# Patient Record
Sex: Male | Born: 1985 | Race: White | Hispanic: No | Marital: Married | Smoking: Never smoker
Health system: Southern US, Community
[De-identification: ages and names within clinical notes are randomized; demographics above are authoritative.]

## PROBLEM LIST (undated history)

## (undated) DIAGNOSIS — N200 Calculus of kidney: Secondary | ICD-10-CM

## (undated) DIAGNOSIS — J45909 Unspecified asthma, uncomplicated: Secondary | ICD-10-CM

## (undated) HISTORY — DX: Unspecified asthma, uncomplicated: J45.909

## (undated) HISTORY — PX: ANKLE SURGERY: SHX546

---

## 2013-12-03 ENCOUNTER — Ambulatory Visit (INDEPENDENT_AMBULATORY_CARE_PROVIDER_SITE_OTHER): Payer: Managed Care, Other (non HMO) | Admitting: Internal Medicine

## 2013-12-03 ENCOUNTER — Encounter: Payer: Self-pay | Admitting: Internal Medicine

## 2013-12-03 VITALS — BP 116/68 | HR 68 | Temp 98.4°F | Ht 64.5 in | Wt 163.0 lb

## 2013-12-03 DIAGNOSIS — J45909 Unspecified asthma, uncomplicated: Secondary | ICD-10-CM

## 2013-12-03 DIAGNOSIS — S9700XA Crushing injury of unspecified ankle, initial encounter: Secondary | ICD-10-CM

## 2013-12-03 DIAGNOSIS — S9702XA Crushing injury of left ankle, initial encounter: Secondary | ICD-10-CM

## 2013-12-03 NOTE — Patient Instructions (Addendum)
You have no evidence of any ankle deformity, ankle pain, decreased strength or ability to bear weight in the ankle. Your spirometry results that you do not have asthma.    Health Maintenance, Males A healthy lifestyle and preventative care can promote health and wellness.  Maintain regular health, dental, and eye exams.  Eat a healthy diet. Foods like vegetables, fruits, whole grains, low-fat dairy products, and lean protein foods contain the nutrients you need and are low in calories. Decrease your intake of foods high in solid fats, added sugars, and salt. Get information about a proper diet from your health care provider, if necessary.  Regular physical exercise is one of the most important things you can do for your health. Most adults should get at least 150 minutes of moderate-intensity exercise (any activity that increases your heart rate and causes you to sweat) each week. In addition, most adults need muscle-strengthening exercises on 2 or more days a week.   Maintain a healthy weight. The body mass index (BMI) is a screening tool to identify possible weight problems. It provides an estimate of body fat based on height and weight. Your health care provider can find your BMI and can help you achieve or maintain a healthy weight. For males 20 years and older:  A BMI below 18.5 is considered underweight.  A BMI of 18.5 to 24.9 is normal.  A BMI of 25 to 29.9 is considered overweight.  A BMI of 30 and above is considered obese.  Maintain normal blood lipids and cholesterol by exercising and minimizing your intake of saturated fat. Eat a balanced diet with plenty of fruits and vegetables. Blood tests for lipids and cholesterol should begin at age 21 and be repeated every 5 years. If your lipid or cholesterol levels are high, you are over 50, or you are at high risk for heart disease, you may need your cholesterol levels checked more frequently.Ongoing high lipid and cholesterol levels  should be treated with medicines, if diet and exercise are not working.  If you smoke, find out from your health care provider how to quit. If you do not use tobacco, do not start.  Lung cancer screening is recommended for adults aged 61 80 years who are at high risk for developing lung cancer because of a history of smoking. A yearly low-dose CT scan of the lungs is recommended for people who have at least a 30-pack-year history of smoking and are a current smoker or have quit within the past 15 years. A pack year of smoking is smoking an average of 1 pack of cigarettes a day for 1 year (for example, a 30-pack-year history of smoking could mean smoking 1 pack a day for 30 years or 2 packs a day for 15 years). Yearly screening should continue until the smoker has stopped smoking for at least 15 years. Yearly screening should be stopped for people who develop a health problem that would prevent them from having lung cancer treatment.  If you choose to drink alcohol, do not have more than 2 drinks per day. One drink is considered to be 12 oz (360 mL) of beer, 5 oz (150 mL) of wine, or 1.5 oz (45 mL) of liquor.  Avoid use of street drugs. Do not share needles with anyone. Ask for help if you need support or instructions about stopping the use of drugs.  High blood pressure causes heart disease and increases the risk of stroke. Blood pressure should be checked at least  every 1 2 years. Ongoing high blood pressure should be treated with medicines if weight loss and exercise are not effective.  If you are 33 27 years old, ask your health care provider if you should take aspirin to prevent heart disease.  Diabetes screening involves taking a blood sample to check your fasting blood sugar level. This should be done once every 3 years after age 45, if you are at a normal weight and without risk factors for diabetes. Testing should be considered at a younger age or be carried out more frequently if you are  overweight and have at least 1 risk factor for diabetes.  Colorectal cancer can be detected and often prevented. Most routine colorectal cancer screening begins at the age of 99 and continues through age 55. However, your health care provider may recommend screening at an earlier age if you have risk factors for colon cancer. On a yearly basis, your health care provider may provide home test kits to check for hidden blood in the stool. A small camera at the end of a tube may be used to directly examine the colon (sigmoidoscopy or colonoscopy) to detect the earliest forms of colorectal cancer. Talk to your health care provider about this at age 69, when routine screening begins. A direct exam of the colon should be repeated every 5 10 years through age 40, unless early forms of pre-cancerous polyps or small growths are found.  People who are at an increased risk for hepatitis B should be screened for this virus. You are considered at high risk for hepatitis B if:  You were born in a country where hepatitis B occurs often. Talk with your health care provider about which countries are considered high-risk.  Your parents were born in a high-risk country and you have not received a shot to protect against hepatitis B (hepatitis B vaccine).  You have HIV or AIDS.  You use needles to inject street drugs.  You live with, or have sex with, someone who has hepatitis B.  You are a man who has sex with other men (MSM).  You get hemodialysis treatment.  You take certain medicines for conditions like cancer, organ transplantation, and autoimmune conditions.  Hepatitis C blood testing is recommended for all people born from [birth year] through 1964 and any individual with known risk factors for hepatitis C.  Healthy men should no longer receive prostate-specific antigen (PSA) blood tests as part of routine cancer screening. Talk to your health care provider about prostate cancer screening.  Testicular cancer  screening is not recommended for adolescents or adult males who have no symptoms. Screening includes self-exam, a health care provider exam, and other screening tests. Consult with your health care provider about any symptoms you have or any concerns you have about testicular cancer.  Practice safe sex. Use condoms and avoid high-risk sexual practices to reduce the spread of sexually transmitted infections (STIs).  Use sunscreen. Apply sunscreen liberally and repeatedly throughout the day. You should seek shade when your shadow is shorter than you. Protect yourself by wearing long sleeves, pants, a wide-brimmed hat, and sunglasses year round, whenever you are outdoors.  Tell your health care provider of new moles or changes in moles, especially if there is a change in shape or color. Also tell your provider if a mole is larger than the size of a pencil eraser.  A one-time screening for abdominal aortic aneurysm (AAA) and surgical repair of large AAAs by ultrasound is recommended for men  aged 27 75 years who are current or former smokers.  Stay current with your vaccines (immunizations). Document Released: 02/09/2008 Document Revised: 06/03/2013 Document Reviewed: 01/08/2011 [facility] Patient Information 2014 [place], [place].

## 2013-12-03 NOTE — Progress Notes (Signed)
Pre visit review using our clinic review tool, if applicable. No additional management support is needed unless otherwise documented below in the visit note. 

## 2013-12-03 NOTE — Progress Notes (Signed)
HPI  Pt presents to the clinic today to establish care. He does not have a PCP. He does report that he had his left ankle crushed by a truck in 2008. He did have pins put at during that time. He no longer has any pain. He does just need to make sure it is stable so that he can go into the [employer]. He also needs repeat spirometry to make sure he no longer has asthma. This was a childhood issue. He denies wheezing, or shortness of breath.  Past Medical History  Diagnosis Date  . Asthma     No current outpatient prescriptions on file.   No current facility-administered medications for this visit.    Allergies  Allergen Reactions  . Sulfa Antibiotics Hives    Family History  Problem Relation Age of Onset  . Hypertension Mother   . Hypertension Maternal Grandmother   . Hypertension Maternal Grandfather     History   Social History  . Marital Status: Married    Spouse Name: N/A    Number of Children: N/A  . Years of Education: N/A   Occupational History  . Not on file.   Social History Main Topics  . Smoking status: Never Smoker   . Smokeless tobacco: Not on file  . Alcohol Use: No  . Drug Use: No  . Sexual Activity: Not on file   Other Topics Concern  . Not on file   Social History Narrative  . No narrative on file    ROS:  Constitutional: Denies fever, malaise, fatigue, headache or abrupt weight changes.  Respiratory: Denies difficulty breathing, shortness of breath, cough or sputum production.   Cardiovascular: Denies chest pain, chest tightness, palpitations or swelling in the hands or feet.  Musculoskeletal: Denies decrease in range of motion, difficulty with gait, muscle pain or joint pain and swelling.   No other specific complaints in a complete review of systems (except as listed in HPI above).  PE:  BP 116/68  Pulse 68  Temp(Src) 98.4 F (36.9 C) (Oral)  Ht 5' 4.5" (1.638 m)  Wt 163 lb (73.936 kg)  BMI 27.56 kg/m2  SpO2 98% Wt Readings from  Last 3 Encounters:  12/03/13 163 lb (73.936 kg)    General: Appears his stated age, well developed, well nourished in NAD. Cardiovascular: Normal rate and rhythm. S1,S2 noted.  No murmur, rubs or gallops noted. No JVD or BLE edema. No carotid bruits noted. Pulmonary/Chest: Normal effort and positive vesicular breath sounds. No respiratory distress. No wheezes, rales or ronchi noted.  Musculoskeletal: Normal range of motion of left ankle. No signs of joint swelling. No difficulty with gait. Strength normal    Assessment and Plan:  Left ankle injury:  Has resolved No weight bearing concerns at this time  History of childhood asthma:  Will perform spirometry today No evidence of asthma  RTC as needed

## 2014-02-09 ENCOUNTER — Telehealth: Payer: Self-pay

## 2014-02-09 NOTE — Telephone Encounter (Signed)
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 Wanted Nicki Reaperegina Baity NP to be aware fax should arrive on 02/10/14.

## 2014-02-10 ENCOUNTER — Encounter: Payer: Self-pay | Admitting: Internal Medicine

## 2014-02-10 NOTE — Telephone Encounter (Signed)
Ok I will look for it in my inbox

## 2014-02-10 NOTE — Telephone Encounter (Signed)
Done, placed in my outbox.

## 2014-02-11 NOTE — Telephone Encounter (Signed)
The letter and office visit notes were faxed 02/10/14--

## 2016-08-17 ENCOUNTER — Encounter: Payer: Managed Care, Other (non HMO) | Admitting: Internal Medicine

## 2016-08-17 ENCOUNTER — Telehealth: Payer: Self-pay | Admitting: Internal Medicine

## 2016-08-17 NOTE — Telephone Encounter (Signed)
Patient did not come in for their appointment today for cpe.  Please let me know if patient needs to be contacted immediately for follow up or no follow up needed. °

## 2016-08-17 NOTE — Telephone Encounter (Signed)
Yes, he needs to reschedule 

## 2016-08-21 NOTE — Telephone Encounter (Signed)
Pt declined to reschedule appt

## 2016-11-06 ENCOUNTER — Telehealth: Payer: Self-pay | Admitting: Internal Medicine

## 2016-11-06 NOTE — Telephone Encounter (Signed)
Wife states that husband did not make appt for cpe on 12/22. Wife is asking for no show fee to be forgiven. Please call [phone] Thanks

## 2017-01-02 NOTE — Telephone Encounter (Signed)
Patient's wife called and said she received another no charge bill.  Please call Brianna back.

## 2017-07-09 DIAGNOSIS — Z8669 Personal history of other diseases of the nervous system and sense organs: Secondary | ICD-10-CM | POA: Insufficient documentation

## 2018-04-15 ENCOUNTER — Telehealth: Payer: Self-pay | Admitting: Internal Medicine

## 2018-04-15 NOTE — Telephone Encounter (Signed)
See below crm New patient 11/2013 No show for cpx 07/2013 Ok to working?  Copied from CRM [phone]. Topic: Appointment Scheduling - Scheduling Inquiry for Clinic >> Apr 14, 2018  3:06 PM Oneal GroutSebastian, Jennifer S wrote: Reason for CRM: Patient last seen in 2015 with Nicki Reaperegina Baity, requesting CPE. Needs by 1st of Sept.  Able to work in?

## 2018-04-15 NOTE — Telephone Encounter (Signed)
Yes, can reestablish at time of physical.

## 2018-04-15 NOTE — Telephone Encounter (Signed)
Appointment 8/27 pt aware

## 2018-04-15 NOTE — Telephone Encounter (Signed)
Can I work him in before sept 1st

## 2018-04-15 NOTE — Telephone Encounter (Signed)
yes

## 2018-04-22 ENCOUNTER — Ambulatory Visit (INDEPENDENT_AMBULATORY_CARE_PROVIDER_SITE_OTHER): Payer: 59 | Admitting: Internal Medicine

## 2018-04-22 ENCOUNTER — Encounter: Payer: Self-pay | Admitting: Internal Medicine

## 2018-04-22 ENCOUNTER — Encounter (INDEPENDENT_AMBULATORY_CARE_PROVIDER_SITE_OTHER): Payer: Self-pay

## 2018-04-22 VITALS — BP 114/78 | HR 65 | Temp 97.9°F | Ht 64.5 in | Wt 186.0 lb

## 2018-04-22 DIAGNOSIS — Z0000 Encounter for general adult medical examination without abnormal findings: Secondary | ICD-10-CM

## 2018-04-22 DIAGNOSIS — K219 Gastro-esophageal reflux disease without esophagitis: Secondary | ICD-10-CM

## 2018-04-22 DIAGNOSIS — J45909 Unspecified asthma, uncomplicated: Secondary | ICD-10-CM | POA: Insufficient documentation

## 2018-04-22 DIAGNOSIS — J4520 Mild intermittent asthma, uncomplicated: Secondary | ICD-10-CM

## 2018-04-22 NOTE — Assessment & Plan Note (Signed)
Discussed how weight loss can help improve reflux Start Ranitidine 75 mg OTC QHS  Let me know if not improving

## 2018-04-22 NOTE — Progress Notes (Signed)
HPI  Pt presents to the clinic today to reestablish care. He would like his annual exam today.  Childhood Asthma: Currently not an issue. He does not use any inhalers.  Flu: never Tetanus: 2008 Dentist: annually  Diet: He does eat meat. He consumes fruits and veggies daily. He rarely eats fried foods. He drinks mostly water and coffee. Exercise: He goes to the gym for about 1.5 hours 3-4 days per week.  Past Medical History:  Diagnosis Date  . Asthma     No current outpatient medications on file.   No current facility-administered medications for this visit.     Allergies  Allergen Reactions  . Sulfa Antibiotics Hives    Family History  Problem Relation Age of Onset  . Hypertension Mother   . Hypertension Maternal Grandmother   . Hypertension Maternal Grandfather     Social History   Socioeconomic History  . Marital status: Married    Spouse name: Not on file  . Number of children: Not on file  . Years of education: Not on file  . Highest education level: Not on file  Occupational History  . Not on file  Social Needs  . Financial resource strain: Not on file  . Food insecurity:    Worry: Not on file    Inability: Not on file  . Transportation needs:    Medical: Not on file    Non-medical: Not on file  Tobacco Use  . Smoking status: Never Smoker  . Smokeless tobacco: Never Used  Substance and Sexual Activity  . Alcohol use: No  . Drug use: No  . Sexual activity: Not on file  Lifestyle  . Physical activity:    Days per week: Not on file    Minutes per session: Not on file  . Stress: Not on file  Relationships  . Social connections:    Talks on phone: Not on file    Gets together: Not on file    Attends religious service: Not on file    Active member of club or organization: Not on file    Attends meetings of clubs or organizations: Not on file    Relationship status: Not on file  . Intimate partner violence:    Fear of current or ex partner: Not  on file    Emotionally abused: Not on file    Physically abused: Not on file    Forced sexual activity: Not on file  Other Topics Concern  . Not on file  Social History Narrative  . Not on file    ROS:  Constitutional: Denies fever, malaise, fatigue, headache or abrupt weight changes.  HEENT: Denies eye pain, eye redness, ear pain, ringing in the ears, wax buildup, runny nose, nasal congestion, bloody nose, or sore throat. Respiratory: Denies difficulty breathing, shortness of breath, cough or sputum production.   Cardiovascular: Denies chest pain, chest tightness, palpitations or swelling in the hands or feet.  Gastrointestinal: Pt reports reflux. Denies abdominal pain, bloating, constipation, diarrhea or blood in the stool.  GU: Denies frequency, urgency, pain with urination, blood in urine, odor or discharge. Musculoskeletal: Denies decrease in range of motion, difficulty with gait, muscle pain or joint pain and swelling.  Skin: Denies redness, rashes, lesions or ulcercations.  Neurological: Denies dizziness, difficulty with memory, difficulty with speech or problems with balance and coordination.  Psych: Denies anxiety, depression, SI/HI.  No other specific complaints in a complete review of systems (except as listed in HPI above).  PE:  BP 114/78   Pulse 65   Temp 97.9 F (36.6 C) (Oral)   Ht 5' 4.5" (1.638 m)   Wt 186 lb (84.4 kg)   SpO2 98%   BMI 31.43 kg/m  Wt Readings from Last 3 Encounters:  04/22/18 186 lb (84.4 kg)  12/03/13 163 lb (73.9 kg)    General: Appears his stated age, well developed, well nourished in NAD. HEENT: Head: normal shape and size; Eyes: sclera white, no icterus, conjunctiva pink, PERRLA and EOMs intact; Ears: Tm's gray and intact, normal light reflex; Throat/Mouth: Teeth present, mucosa pink and moist, no lesions or ulcerations noted.  Neck: Neck supple, trachea midline. No masses, lumps or thyromegaly present.  Cardiovascular: Normal rate  and rhythm. S1,S2 noted.  No murmur, rubs or gallops noted. No JVD or BLE edema.  Pulmonary/Chest: Normal effort and positive vesicular breath sounds. No respiratory distress. No wheezes, rales or ronchi noted.  Abdomen: Soft and nontender. Normal bowel sounds, no bruits noted. No distention or masses noted. Liver, spleen and kidneys non palpable. Musculoskeletal:  Strength 5/5 BUE/BLE. No difficulty with gait.  Neurological: Alert and oriented. Cranial nerves II-XII grossly intact. Coordination normal.  Psychiatric: Mood and affect normal. Behavior is normal. Judgment and thought content normal.    Assessment and Plan:  Preventative Health Maintenance:  Encouraged him to get a flu shot in the fall He declines tetanus booster today Encouraged him to consume a balanced diet and exercise regimen Advised him to see an eye doctor and dentist annually He had labs through employer which were reviewed  RTC in 1 year, sooner if needed  Nicki Reaperegina Baity, NP

## 2018-04-22 NOTE — Patient Instructions (Signed)

## 2018-04-22 NOTE — Assessment & Plan Note (Signed)
Currently not an issue Will monitor 

## 2019-04-19 ENCOUNTER — Emergency Department: Payer: No Typology Code available for payment source

## 2019-04-19 ENCOUNTER — Other Ambulatory Visit: Payer: Self-pay

## 2019-04-19 DIAGNOSIS — R1011 Right upper quadrant pain: Secondary | ICD-10-CM | POA: Diagnosis present

## 2019-04-19 DIAGNOSIS — J45909 Unspecified asthma, uncomplicated: Secondary | ICD-10-CM | POA: Diagnosis not present

## 2019-04-19 DIAGNOSIS — N200 Calculus of kidney: Secondary | ICD-10-CM | POA: Diagnosis not present

## 2019-04-19 LAB — COMPREHENSIVE METABOLIC PANEL
ALT: 59 U/L — ABNORMAL HIGH (ref 0–44)
AST: 42 U/L — ABNORMAL HIGH (ref 15–41)
Albumin: 4.3 g/dL (ref 3.5–5.0)
Alkaline Phosphatase: 76 U/L (ref 38–126)
Anion gap: 9 (ref 5–15)
BUN: 14 mg/dL (ref 6–20)
CO2: 24 mmol/L (ref 22–32)
Calcium: 8.8 mg/dL — ABNORMAL LOW (ref 8.9–10.3)
Chloride: 105 mmol/L (ref 98–111)
Creatinine, Ser: 0.9 mg/dL (ref 0.61–1.24)
GFR calc Af Amer: >60 mL/min (ref >60)
GFR calc non Af Amer: >60 mL/min (ref >60)
Glucose, Bld: 127 mg/dL — ABNORMAL HIGH (ref 70–99)
Potassium: 3.8 mmol/L (ref 3.5–5.1)
Sodium: 138 mmol/L (ref 135–145)
Total Bilirubin: 1.1 mg/dL (ref 0.3–1.2)
Total Protein: 7.3 g/dL (ref 6.5–8.1)

## 2019-04-19 LAB — CBC WITH DIFFERENTIAL/PLATELET
Abs Immature Granulocytes: 0.04 10*3/uL (ref 0.00–0.07)
Basophils Absolute: 0.1 10*3/uL (ref 0.0–0.1)
Basophils Relative: 1 %
Eosinophils Absolute: 0.1 10*3/uL (ref 0.0–0.5)
Eosinophils Relative: 1 %
HCT: 46.1 % (ref 39.0–52.0)
Hemoglobin: 16.3 g/dL (ref 13.0–17.0)
Immature Granulocytes: 0 %
Lymphocytes Relative: 15 %
Lymphs Abs: 1.6 10*3/uL (ref 0.7–4.0)
MCH: 31.8 pg (ref 26.0–34.0)
MCHC: 35.4 g/dL (ref 30.0–36.0)
MCV: 90 fL (ref 80.0–100.0)
Monocytes Absolute: 1 10*3/uL (ref 0.1–1.0)
Monocytes Relative: 9 %
Neutro Abs: 8.1 10*3/uL — ABNORMAL HIGH (ref 1.7–7.7)
Neutrophils Relative %: 74 %
Platelets: 209 10*3/uL (ref 150–400)
RBC: 5.12 MIL/uL (ref 4.22–5.81)
RDW: 12.2 % (ref 11.5–15.5)
WBC: 10.8 10*3/uL — ABNORMAL HIGH (ref 4.0–10.5)
nRBC: 0 % (ref 0.0–0.2)

## 2019-04-19 MED ORDER — ONDANSETRON HCL 4 MG/2ML IJ SOLN: 4.0000 mg | Freq: Once | INTRAMUSCULAR | Status: DC

## 2019-04-19 MED ORDER — ONDANSETRON HCL 4 MG/2ML IJ SOLN
INTRAMUSCULAR | Status: AC
  Administered 2019-04-19: 4 mg
  Filled 2019-04-19: qty 2

## 2019-04-19 MED ORDER — FENTANYL CITRATE (PF) 100 MCG/2ML IJ SOLN
INTRAMUSCULAR | Status: AC
  Administered 2019-04-19: 23:00:00 50 ug via INTRAVENOUS
  Filled 2019-04-19: qty 2

## 2019-04-19 MED ORDER — FENTANYL CITRATE (PF) 100 MCG/2ML IJ SOLN
50.0000 ug | INTRAMUSCULAR | Status: DC | PRN
  Administered 2019-04-19: 50 ug via INTRAVENOUS

## 2019-04-19 NOTE — ED Notes (Signed)
Patient to US at this time via wheelchair.

## 2019-04-19 NOTE — ED Triage Notes (Signed)
Patient presents with right upper quadrant pain that extends down into the flank and over the bladder. Patient states he has been unable to eat anything. Tried to make himself vomit but cannot. No history of kidney stones in the past.

## 2019-04-20 ENCOUNTER — Emergency Department
Admission: EM | Admit: 2019-04-20 | Discharge: 2019-04-20 | Disposition: A | Discharge status: Home / Self Care | Payer: No Typology Code available for payment source | Attending: Emergency Medicine | Admitting: Emergency Medicine

## 2019-04-20 ENCOUNTER — Emergency Department: Payer: No Typology Code available for payment source

## 2019-04-20 DIAGNOSIS — R1011 Right upper quadrant pain: Secondary | ICD-10-CM

## 2019-04-20 DIAGNOSIS — N200 Calculus of kidney: Secondary | ICD-10-CM

## 2019-04-20 LAB — URINALYSIS, ROUTINE W REFLEX MICROSCOPIC
Bilirubin Urine: NEGATIVE
Glucose, UA: NEGATIVE mg/dL
Ketones, ur: 20 mg/dL — AB
Leukocytes,Ua: NEGATIVE
Nitrite: NEGATIVE
Protein, ur: 100 mg/dL — AB
RBC / HPF: >50 RBC/hpf — ABNORMAL HIGH (ref 0–5)
Specific Gravity, Urine: 1.02 (ref 1.005–1.030)
pH: 6 (ref 5.0–8.0)

## 2019-04-20 LAB — LIPASE, BLOOD: Lipase: 22 U/L (ref 11–51)

## 2019-04-20 MED ORDER — HYDROCODONE-ACETAMINOPHEN 5-325 MG PO TABS
1.0000 | ORAL_TABLET | Freq: Once | ORAL | Status: AC
  Administered 2019-04-20: 04:00:00 1 via ORAL
  Filled 2019-04-20: qty 1

## 2019-04-20 MED ORDER — ONDANSETRON 4 MG PO TBDP: 4.0000 mg | ORAL_TABLET | Freq: Four times a day (QID) | ORAL | 0 refills | Status: DC | PRN

## 2019-04-20 MED ORDER — HYDROCODONE-ACETAMINOPHEN 5-325 MG PO TABS: 1.0000 | ORAL_TABLET | Freq: Four times a day (QID) | ORAL | 0 refills | Status: DC | PRN

## 2019-04-20 MED ORDER — IBUPROFEN 400 MG PO TABS
400.0000 mg | ORAL_TABLET | ORAL | Status: AC
  Administered 2019-04-20: 400 mg via ORAL
  Filled 2019-04-20: qty 1

## 2019-04-20 MED ORDER — KETOROLAC TROMETHAMINE 30 MG/ML IJ SOLN
30.0000 mg | Freq: Once | INTRAMUSCULAR | Status: AC
  Administered 2019-04-20: 01:00:00 30 mg via INTRAVENOUS
  Filled 2019-04-20: qty 1

## 2019-04-20 NOTE — Discharge Instructions (Addendum)
You have been seen in the Emergency Department (ED) today for pain that we believe based on your workup, is caused by kidney stones.  As we have discussed, please drink plenty of fluids.  Please make a follow up appointment with the physician(s) listed elsewhere in this documentation. ° °You may take pain medication as needed but ONLY as prescribed.   We also recommend that you take over-the-counter ibuprofen regularly according to label instructions over the next 5 days.  Take it with meals to minimize stomach discomfort. ° °Please see your doctor as soon as possible as stones may take 1-3 weeks to pass and you may require additional care or medications. ° °Do not drink alcohol, drive or participate in any other potentially dangerous activities while taking opiate pain medication as it may make you sleepy. Do not take this medication with any other sedating medications, either prescription or over-the-counter. If you were prescribed Percocet or Vicodin, do not take these with acetaminophen (Tylenol) as it is already contained within these medications. °  °This medication is an opiate (or narcotic) pain medication and can be habit forming.  Use it as little as possible to achieve adequate pain control.  Do not use or use it with extreme caution if you have a history of opiate abuse or dependence.  If you are on a pain contract with your primary care doctor or a pain specialist, be sure to let them know you were prescribed this medication today from the [facility].  This medication is intended for your use only - do not give any to anyone else and keep it in a secure place where nobody else, especially children, have access to it.  It will also cause or worsen constipation, so you may want to consider taking an over-the-counter stool softener while you are taking this medication. ° °Return to the Emergency Department (ED) or call your doctor if you have any worsening pain, fever, painful  urination, are unable to urinate, or develop other symptoms that concern you. ° °

## 2019-04-20 NOTE — ED Provider Notes (Signed)
[facility] Emergency Department Provider Note   ____________________________________________   First MD Initiated Contact with Patient 04/20/19 0100     (approximate)  I have reviewed the triage vital signs and the nursing notes.   HISTORY  Chief Complaint Abdominal Pain (Right upper quadrant)    HPI Eric Erickson. is a 33 y.o. male here for evaluation of abdominal pain  Patient reports this morning he got up he noticed when he urinated he had some slight blood in it.  A bit of discomfort and anything might show.  Then went throughout the day, started having increased pain tonight, reports of pain over the right flank right lower quadrant associated with urinary frequency.  He said the pain got pretty bad for little while, and then it now is calm back down.  No vomiting.  No fevers or chills.  No exposure to anyone with coronavirus.  No cough.  No shortness of breath  Reports he is feeling better still having some achiness over the right flank region, but not any severe pain like it was earlier.  Has never had anything like this before.   Past Medical History:  Diagnosis Date  . Asthma     Patient Active Problem List   Diagnosis Date Noted  . GERD (gastroesophageal reflux disease) 04/22/2018  . 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 ANKLE SURGERY Left    screw put in    Prior to Admission medications   Medication Sig Start Date End Date Taking? Authorizing Provider  HYDROcodone-acetaminophen (NORCO/VICODIN) 5-325 MG tablet Take 1-2 tablets by mouth every 6 (six) hours as needed for moderate pain or severe pain. 04/20/19   Delman Kitten, MD  ondansetron (ZOFRAN ODT) 4 MG disintegrating tablet Take 1 tablet (4 mg total) by mouth every 6 (six) hours as needed for nausea or vomiting. 04/20/19   Delman Kitten, MD    Allergies Sulfa antibiotics  Family History  Problem Relation Age of Onset  .  Hypertension Mother   . Hypertension Maternal Grandmother   . Hypertension Maternal Grandfather     Social History Social History   Tobacco Use  . Smoking status: Never Smoker  . Smokeless tobacco: Never Used  Substance Use Topics  . Alcohol use: No  . Drug use: No    Review of Systems Constitutional: No fever/chills Eyes: No visual changes. ENT: No sore throat. Cardiovascular: Denies chest pain. Respiratory: Denies shortness of breath. Gastrointestinal: See HPI Genitourinary: Negative for dysuria.  Urgency and some slight blood in his urine. Musculoskeletal: Negative for back pain. Skin: Negative for rash. Neurological: Negative for headaches, areas of focal weakness or numbness.    ____________________________________________   PHYSICAL EXAM:  VITAL SIGNS: ED Triage Vitals  Enc Vitals Group     BP 04/19/19 2251 (!) 137/109     Pulse Rate 04/19/19 2251 (!) 50     Resp 04/19/19 2251 (!) 22     Temp 04/19/19 2251 98 F (36.7 C)     Temp Source 04/19/19 2251 Oral     SpO2 04/19/19 2251 98 %     Weight 04/19/19 2252 175 lb (79.4 kg)     Height 04/19/19 2252 5\' 6"  (1.676 m)     Head Circumference --      Peak Flow --      Pain Score 04/19/19 2252 10     Pain Loc --      Pain Edu? --  Excl. in GC? --     Constitutional: Alert and oriented. Well appearing and in no acute distress. Eyes: Conjunctivae are normal. Head: Atraumatic. Nose: No congestion/rhinnorhea. Mouth/Throat: Mucous membranes are moist. Neck: No stridor.  Cardiovascular: Normal rate, regular rhythm. Grossly normal heart sounds.  Good peripheral circulation. Respiratory: Normal respiratory effort.  No retractions. Lungs CTAB. Gastrointestinal: Soft and nontender. No distention.  Has moderate right-sided CVA tenderness.  No tenderness on left. Musculoskeletal: No lower extremity tenderness nor edema. Neurologic:  Normal speech and language. No gross focal neurologic deficits are appreciated.   Skin:  Skin is warm, dry and intact. No rash noted. Psychiatric: Mood and affect are normal. Speech and behavior are normal.  ____________________________________________   LABS (all labs ordered are listed, but only abnormal results are displayed)  Labs Reviewed  COMPREHENSIVE METABOLIC PANEL - Abnormal; Notable for the following components:      Result Value   Glucose, Bld 127 (*)    Calcium 8.8 (*)    AST 42 (*)    ALT 59 (*)    All other components within normal limits  CBC WITH DIFFERENTIAL/PLATELET - Abnormal; Notable for the following components:   WBC 10.8 (*)    Neutro Abs 8.1 (*)    All other components within normal limits  URINALYSIS, ROUTINE W REFLEX MICROSCOPIC - Abnormal; Notable for the following components:   Color, Urine YELLOW (*)    APPearance CLOUDY (*)    Hgb urine dipstick LARGE (*)    Ketones, ur 20 (*)    Protein, ur 100 (*)    RBC / HPF >50 (*)    Bacteria, UA RARE (*)    All other components within normal limits  URINE CULTURE  LIPASE, BLOOD   ____________________________________________  EKG   ____________________________________________  RADIOLOGY  Ct Renal Stone Study  Result Date: 04/20/2019 CLINICAL DATA:  Right upper quadrant pain extending into the flank. EXAM: CT ABDOMEN AND PELVIS WITHOUT CONTRAST TECHNIQUE: Multidetector CT imaging of the abdomen and pelvis was performed following the standard protocol without IV contrast. COMPARISON:  None. FINDINGS: LOWER CHEST: There is no basilar pleural or apical pericardial effusion. HEPATOBILIARY: The hepatic contours and density are normal. There is no intra- or extrahepatic biliary dilatation. The gallbladder is normal. PANCREAS: The pancreatic parenchymal contours are normal and there is no ductal dilatation. There is no peripancreatic fluid collection. SPLEEN: Normal. ADRENALS/URINARY TRACT: --Adrenal glands: Normal. --Right kidney/ureter: There is a stone within the proximal ureter  measuring 4 x 4 x 5 mm, causing mild hydronephrosis and mild perinephric stranding. There is a nonobstructing stone at the renal pelvis measuring 1-2 mm. --Left kidney/ureter: Multiple nonobstructing renal calculi, measuring up to 3 mm. No hydronephrosis, perinephric stranding or solid renal mass. --Urinary bladder: Normal for degree of distention STOMACH/BOWEL: --Stomach/Duodenum: There is no hiatal hernia or other gastric abnormality. The duodenal course and caliber are normal. --Small bowel: No dilatation or inflammation. --Colon: No focal abnormality. --Appendix: Normal. VASCULAR/LYMPHATIC: Normal course and caliber of the major abdominal vessels. No abdominal or pelvic lymphadenopathy. REPRODUCTIVE: No free fluid in the pelvis. MUSCULOSKELETAL. No bony spinal canal stenosis or focal osseous abnormality. OTHER: None. IMPRESSION: 1. Right-sided obstructive uropathy with 4 x 4 x 5 mm stone within the proximal ureter causing mild hydronephrosis and perinephric stranding. 2. Multiple nonobstructing bilateral renal calculi. Electronically Signed   By: Deatra RobinsonKevin  Herman M.D.   On: 04/20/2019 02:14   [place] Abdomen Limited Ruq  Result Date: 04/20/2019 CLINICAL DATA:  Right upper  quadrant pain EXAM: ULTRASOUND ABDOMEN LIMITED RIGHT UPPER QUADRANT COMPARISON:  None. FINDINGS: Gallbladder: No gallstones or wall thickening visualized. No sonographic Murphy sign noted by sonographer. Common bile duct: Diameter: 3.4 mm Liver: No focal lesion identified. Within normal limits in parenchymal echogenicity. Portal vein is patent on color Doppler imaging with normal direction of blood flow towards the liver. Other: None. IMPRESSION: Negative right upper quadrant abdominal ultrasound Electronically Signed   By: Jasmine PangKim  Fujinaga M.D.   On: 04/20/2019 00:10    CT reviewed, right-sided 4 x 4 x 5 mm stone ____________________________________________   PROCEDURES  Procedure(s) performed: None  Procedures  Critical Care performed:  No  ____________________________________________   INITIAL IMPRESSION / ASSESSMENT AND PLAN / ED COURSE  Pertinent labs & imaging results that were available during my care of the patient were reviewed by me and considered in my medical decision making (see chart for details).   Differential diagnosis includes but is not limited to, abdominal perforation, aortic dissection, cholecystitis, appendicitis, diverticulitis, colitis, esophagitis/gastritis, kidney stone, pyelonephritis, urinary tract infection, aortic aneurysm. All are considered in decision and treatment plan. Based upon the patient's presentation and risk factors, review of urinalysis as well she has some hematuria.  No infectious symptoms.  Will proceed with CT stone protocol       ----------------------------------------- 3:24 AM on 04/20/2019 -----------------------------------------  Urine sent for culture.  No infectious symptoms.  Negative for nitrates negative for leukocytes, do not suspect acute associated urinary tract infection.  Discussed with patient, pain well controlled, some mild ongoing pain.  Will discharge, provide 1 hydrocodone tablet and Motrin prior to discharge.  Family driving him home.  Carefully reviewed return precautions and diagnosis of right-sided kidney stone.  He will follow closely with urology  Return precautions and treatment recommendations and follow-up discussed with the patient who is agreeable with the plan.  I will prescribe the patient a narcotic pain medicine due to their condition which I anticipate will cause at least moderate pain short term. I discussed with the patient safe use of narcotic pain medicines, and that they are not to drive, work in dangerous areas, or ever take more than prescribed (no more than 1 pill every 6 hours). We discussed that this is the type of medication that can be  overdosed on and the risks of this type of medicine. Patient is very agreeable to only use as  prescribed and to never use more than prescribed.  Patient's prescriber database score is 000  ____________________________________________   FINAL CLINICAL IMPRESSION(S) / ED DIAGNOSES  Final diagnoses:  Kidney stone on right side        Note:  This document was prepared using Dragon voice recognition software and may include unintentional dictation errors       Sharyn CreamerQuale, Lynisha Osuch, MD 04/20/19 [phone]

## 2019-04-20 NOTE — ED Notes (Signed)
Patient given warm blankets at this time.  

## 2019-04-21 ENCOUNTER — Telehealth: Payer: Self-pay | Admitting: Urology

## 2019-04-21 LAB — URINE CULTURE
Culture: NO GROWTH
Special Requests: NORMAL

## 2019-04-21 NOTE — Telephone Encounter (Signed)
Ok to schedule him later this week.

## 2019-04-21 NOTE — Telephone Encounter (Signed)
Pt was seen in th ER for kidney stones and is still having a lot of pain and was calling for a follow up appt.Marland Kitchen He is a new pt to the clinic. Please advise as to when he should be followed up.

## 2019-04-21 NOTE — Telephone Encounter (Signed)
When do you think patient should be seen?

## 2019-04-23 NOTE — Telephone Encounter (Signed)
Please schedule patient for next week if possible, stone is non obstructing per Sam, but if still in severe pain ED Please schedule with a MD he is a new patient, thanks

## 2019-04-24 NOTE — Telephone Encounter (Signed)
App made patient notified.

## 2019-04-30 ENCOUNTER — Ambulatory Visit (INDEPENDENT_AMBULATORY_CARE_PROVIDER_SITE_OTHER): Payer: 59 | Admitting: Urology

## 2019-04-30 ENCOUNTER — Ambulatory Visit
Admission: RE | Admit: 2019-04-30 | Discharge: 2019-04-30 | Disposition: A | Discharge status: Home / Self Care | Payer: 59 | Source: Ambulatory Visit | Attending: Urology | Admitting: Urology

## 2019-04-30 ENCOUNTER — Encounter: Payer: Self-pay | Admitting: Urology

## 2019-04-30 ENCOUNTER — Other Ambulatory Visit: Payer: Self-pay

## 2019-04-30 VITALS — BP 132/84 | HR 73 | Ht 67.0 in | Wt 175.0 lb

## 2019-04-30 DIAGNOSIS — N200 Calculus of kidney: Secondary | ICD-10-CM

## 2019-04-30 DIAGNOSIS — N201 Calculus of ureter: Secondary | ICD-10-CM

## 2019-04-30 DIAGNOSIS — N132 Hydronephrosis with renal and ureteral calculous obstruction: Secondary | ICD-10-CM | POA: Diagnosis not present

## 2019-04-30 NOTE — Progress Notes (Signed)
04/30/2019 8:36 AM   Eric Erickson. 03/27/1986 [id number]  Referring provider: Jearld Fenton, NP [address] [place],  [place] [address]  Chief Complaint  Patient presents with  . Nephrolithiasis    HPI: Eric Erickson is a 33 y.o. male seen for evaluation of a right ureteral calculus.  He presented to [facility] ED on 04/20/2019 with onset of right flank pain radiating to the right lower quadrant.  The pain was initially severe then improved.  He denied fever, chills, nausea or vomiting.  He did note a small amount of blood in his urine earlier in the day prior to onset of pain.  A stone protocol CT of the abdomen pelvis was performed which showed a 4 x 5 mm right proximal ureteral calculus with mild hydronephrosis.  There were nonobstructing left renal calculi.  He denies previous history of stone disease.  No chronic intestinal disease.  Since his ED visit he has had minimal pain.  He is not aware of passing a stone.   PMH: Past Medical History:  Diagnosis Date  . Asthma     Surgical History: Past Surgical History:  Procedure Laterality Date  . ANKLE SURGERY Left    screw put in    Home Medications:  Allergies as of 04/30/2019      Reactions   Sulfasalazine Hives   Sulfa Antibiotics Hives      Medication List       Accurate as of April 30, 2019  8:36 AM. If you have any questions, ask your nurse or doctor.        HYDROcodone-acetaminophen 5-325 MG tablet Commonly known as: NORCO/VICODIN Take 1-2 tablets by mouth every 6 (six) hours as needed for moderate pain or severe pain.   ondansetron 4 MG disintegrating tablet Commonly known as: Zofran ODT Take 1 tablet (4 mg total) by mouth every 6 (six) hours as needed for nausea or vomiting.       Allergies:  Allergies  Allergen Reactions  . Sulfasalazine Hives  . Sulfa Antibiotics Hives    Family History: Family History  Problem Relation Age of Onset  . Hypertension Mother   . Hypertension  Maternal Grandmother   . Hypertension Maternal Grandfather     Social History:  reports that he has never smoked. He has never used smokeless tobacco. He reports that he does not drink alcohol or use drugs.  ROS: UROLOGY Frequent Urination?: No Hard to postpone urination?: No Burning/pain with urination?: No Get up at night to urinate?: No Leakage of urine?: No Urine stream starts and stops?: No Trouble starting stream?: No Blood in urine?: Yes Urinary tract infection?: No Sexually transmitted disease?: No Injury to kidneys or bladder?: No Painful intercourse?: No Weak stream?: No Erection problems?: No Penile pain?: No  Gastrointestinal Nausea?: No Vomiting?: No Indigestion/heartburn?: No Diarrhea?: No Constipation?: No  Constitutional Fever: No Night sweats?: No Weight loss?: No Fatigue?: No  Skin Skin rash/lesions?: No Itching?: No  Eyes Blurred vision?: No Double vision?: No  Ears/Nose/Throat Sore throat?: No Sinus problems?: No  Hematologic/Lymphatic Swollen glands?: No Easy bruising?: No  Cardiovascular Leg swelling?: No Chest pain?: No  Respiratory Cough?: No Shortness of breath?: No  Endocrine Excessive thirst?: No  Musculoskeletal Back pain?: No Joint pain?: No  Neurological Headaches?: No Dizziness?: No  Psychologic Depression?: No Anxiety?: No  Physical Exam: BP 132/84   Pulse 73   Ht 5\' 7"  (1.702 m)   Wt 175 lb (79.4 kg)  BMI 27.41 kg/m   Constitutional:  Alert and oriented, No acute distress. HEENT: [place] AT, moist mucus membranes.  Trachea midline, no masses. Cardiovascular: No clubbing, cyanosis, or edema. Respiratory: Normal respiratory effort, no increased work of breathing. Lymph: No cervical or inguinal lymphadenopathy. Skin: No rashes, bruises or suspicious lesions. Neurologic: Grossly intact, no focal deficits, moving all 4 extremities. Psychiatric: Normal mood and affect.    Pertinent Imaging: CT images  were personally reviewed  Results for orders placed during the hospital encounter of 04/20/19  CT Renal Stone Study   Narrative CLINICAL DATA:  Right upper quadrant pain extending into the flank.  EXAM: CT ABDOMEN AND PELVIS WITHOUT CONTRAST  TECHNIQUE: Multidetector CT imaging of the abdomen and pelvis was performed following the standard protocol without IV contrast.  COMPARISON:  None.  FINDINGS: LOWER CHEST: There is no basilar pleural or apical pericardial effusion.  HEPATOBILIARY: The hepatic contours and density are normal. There is no intra- or extrahepatic biliary dilatation. The gallbladder is normal.  PANCREAS: The pancreatic parenchymal contours are normal and there is no ductal dilatation. There is no peripancreatic fluid collection.  SPLEEN: Normal.  ADRENALS/URINARY TRACT:  --Adrenal glands: Normal.  --Right kidney/ureter: There is a stone within the proximal ureter measuring 4 x 4 x 5 mm, causing mild hydronephrosis and mild perinephric stranding. There is a nonobstructing stone at the renal pelvis measuring 1-2 mm.  --Left kidney/ureter: Multiple nonobstructing renal calculi, measuring up to 3 mm. No hydronephrosis, perinephric stranding or solid renal mass.  --Urinary bladder: Normal for degree of distention  STOMACH/BOWEL:  --Stomach/Duodenum: There is no hiatal hernia or other gastric abnormality. The duodenal course and caliber are normal.  --Small bowel: No dilatation or inflammation.  --Colon: No focal abnormality.  --Appendix: Normal.  VASCULAR/LYMPHATIC: Normal course and caliber of the major abdominal vessels. No abdominal or pelvic lymphadenopathy.  REPRODUCTIVE: No free fluid in the pelvis.  MUSCULOSKELETAL. No bony spinal canal stenosis or focal osseous abnormality.  OTHER: None.  IMPRESSION: 1. Right-sided obstructive uropathy with 4 x 4 x 5 mm stone within the proximal ureter causing mild hydronephrosis and perinephric  stranding. 2. Multiple nonobstructing bilateral renal calculi.   Electronically Signed   By: Deatra RobinsonKevin  Herman M.D.   On: 04/20/2019 02:14     Assessment & Plan:    - Right proximal ureteral calculus Asymptomatic for 1 week.  KUB was ordered and if calculus not visualized will obtain a follow-up renal ultrasound.  If the stone is present with distal progression would recommend a continued trial of passage.  Treatment options were also discussed including shockwave lithotripsy and ureteroscopy.  He will be notified with his KUB results and further recommendations at that time.  - Nephrolithiasis Small nonobstructing renal calculi.  Recommend a metabolic evaluation once his ureteral calculus has resolved.  Riki AltesScott C Stoioff, MD  [facility] [address], [address] [place], [place] [address] [phone]

## 2019-05-04 ENCOUNTER — Other Ambulatory Visit: Payer: Self-pay | Admitting: Urology

## 2019-05-04 DIAGNOSIS — N201 Calculus of ureter: Secondary | ICD-10-CM

## 2019-05-05 ENCOUNTER — Telehealth: Payer: Self-pay | Admitting: Family Medicine

## 2019-05-05 NOTE — Telephone Encounter (Signed)
-----   Message from Abbie Sons, MD sent at 05/04/2019  2:46 PM EDT ----- KUB read reviewed and previous identified calculus was not visualized.  Recommend follow-up renal ultrasound to document resolution of hydronephrosis.  Order entered and will call with results.

## 2019-05-05 NOTE — Telephone Encounter (Signed)
Patient notified and voiced understanding, will wait on imaging to contact him

## 2019-05-06 ENCOUNTER — Encounter: Payer: Self-pay | Admitting: Emergency Medicine

## 2019-05-06 ENCOUNTER — Other Ambulatory Visit: Payer: Self-pay

## 2019-05-06 DIAGNOSIS — N200 Calculus of kidney: Secondary | ICD-10-CM | POA: Diagnosis not present

## 2019-05-06 DIAGNOSIS — J45909 Unspecified asthma, uncomplicated: Secondary | ICD-10-CM | POA: Diagnosis not present

## 2019-05-06 DIAGNOSIS — R1032 Left lower quadrant pain: Secondary | ICD-10-CM | POA: Insufficient documentation

## 2019-05-06 LAB — COMPREHENSIVE METABOLIC PANEL
ALT: 52 U/L — ABNORMAL HIGH (ref 0–44)
AST: 27 U/L (ref 15–41)
Albumin: 4.4 g/dL (ref 3.5–5.0)
Alkaline Phosphatase: 88 U/L (ref 38–126)
Anion gap: 11 (ref 5–15)
BUN: 14 mg/dL (ref 6–20)
CO2: 23 mmol/L (ref 22–32)
Calcium: 8.8 mg/dL — ABNORMAL LOW (ref 8.9–10.3)
Chloride: 106 mmol/L (ref 98–111)
Creatinine, Ser: 1.01 mg/dL (ref 0.61–1.24)
GFR calc Af Amer: >60 mL/min (ref >60)
GFR calc non Af Amer: >60 mL/min (ref >60)
Glucose, Bld: 126 mg/dL — ABNORMAL HIGH (ref 70–99)
Potassium: 3.7 mmol/L (ref 3.5–5.1)
Sodium: 140 mmol/L (ref 135–145)
Total Bilirubin: 0.7 mg/dL (ref 0.3–1.2)
Total Protein: 7.3 g/dL (ref 6.5–8.1)

## 2019-05-06 LAB — CBC WITH DIFFERENTIAL/PLATELET
Abs Immature Granulocytes: 0.05 10*3/uL (ref 0.00–0.07)
Basophils Absolute: 0.1 10*3/uL (ref 0.0–0.1)
Basophils Relative: 0 %
Eosinophils Absolute: 0.1 10*3/uL (ref 0.0–0.5)
Eosinophils Relative: 1 %
HCT: 46.8 % (ref 39.0–52.0)
Hemoglobin: 16.6 g/dL (ref 13.0–17.0)
Immature Granulocytes: 0 %
Lymphocytes Relative: 13 %
Lymphs Abs: 1.7 10*3/uL (ref 0.7–4.0)
MCH: 32.2 pg (ref 26.0–34.0)
MCHC: 35.5 g/dL (ref 30.0–36.0)
MCV: 90.7 fL (ref 80.0–100.0)
Monocytes Absolute: 1 10*3/uL (ref 0.1–1.0)
Monocytes Relative: 8 %
Neutro Abs: 10.2 10*3/uL — ABNORMAL HIGH (ref 1.7–7.7)
Neutrophils Relative %: 78 %
Platelets: 229 10*3/uL (ref 150–400)
RBC: 5.16 MIL/uL (ref 4.22–5.81)
RDW: 12 % (ref 11.5–15.5)
WBC: 13.1 10*3/uL — ABNORMAL HIGH (ref 4.0–10.5)
nRBC: 0 % (ref 0.0–0.2)

## 2019-05-06 NOTE — ED Triage Notes (Signed)
Patient ambulatory to triage with steady gait, without difficulty or distress noted, mask in place; pt reports pain to left side/flank/abd that began tonight with no accomp symptoms; st hx of kidney stones

## 2019-05-07 ENCOUNTER — Emergency Department
Admission: EM | Admit: 2019-05-07 | Discharge: 2019-05-07 | Disposition: A | Discharge status: Home / Self Care | Payer: 59 | Attending: Emergency Medicine | Admitting: Emergency Medicine

## 2019-05-07 ENCOUNTER — Emergency Department: Payer: 59

## 2019-05-07 DIAGNOSIS — R109 Unspecified abdominal pain: Secondary | ICD-10-CM

## 2019-05-07 DIAGNOSIS — N200 Calculus of kidney: Secondary | ICD-10-CM

## 2019-05-07 HISTORY — DX: Calculus of kidney: N20.0

## 2019-05-07 LAB — URINALYSIS, COMPLETE (UACMP) WITH MICROSCOPIC
Bilirubin Urine: NEGATIVE
Glucose, UA: NEGATIVE mg/dL
Ketones, ur: NEGATIVE mg/dL
Leukocytes,Ua: NEGATIVE
Nitrite: NEGATIVE
Protein, ur: 30 mg/dL — AB
RBC / HPF: >50 RBC/hpf — ABNORMAL HIGH (ref 0–5)
Specific Gravity, Urine: 1.021 (ref 1.005–1.030)
pH: 5 (ref 5.0–8.0)

## 2019-05-07 NOTE — Discharge Instructions (Addendum)
You have been seen in the Emergency Department (ED) today for pain caused by kidney stones.  As we have discussed, please drink plenty of fluids.  Please make a follow up appointment with the physician(s) listed elsewhere in this documentation.  Continue taking the medication you have at home.  We also recommend that you take over-the-counter ibuprofen regularly according to label instructions over the next 5 days.  Take it with meals to minimize stomach discomfort.  Please see your doctor as soon as possible as stones may take 1-3 weeks to pass and you may require additional care or medications.  Do not drink alcohol, drive or participate in any other potentially dangerous activities while taking opiate pain medication as it may make you sleepy. Do not take this medication with any other sedating medications, either prescription or over-the-counter. If you were prescribed Percocet or Vicodin, do not take these with acetaminophen (Tylenol) as it is already contained within these medications.   Return to the Emergency Department (ED) or call your doctor if you have any worsening pain, fever, painful urination, are unable to urinate, or develop other symptoms that concern you.

## 2019-05-07 NOTE — ED Notes (Signed)
Pt attempting to provide urine sample now.  

## 2019-05-07 NOTE — ED Notes (Addendum)
Urine sample sent to lab. Dark amber color. Pt given warm blanket. Denies any other needs. Bed locked low. Rail up. Call bell within reach.

## 2019-05-07 NOTE — ED Notes (Signed)
Pt denies any needs. Declined nausea/pain meds. Declined blanket. Rail up. Bed locked low. Call bell within reach.

## 2019-05-07 NOTE — ED Notes (Signed)
EDP Karma Greaser at bedside. Pt understands to provide urine sample once able.

## 2019-05-07 NOTE — ED Provider Notes (Signed)
[facility] Emergency Department Provider Note  ____________________________________________   First MD Initiated Contact with Patient 05/07/19 0101     (approximate)  I have reviewed the triage vital signs and the nursing notes.   HISTORY  Chief Complaint No chief complaint on file.    HPI Eric ChanceRodney Tupy Jr. is a 33 y.o. male    who was seen in the emergency department 2 to 3 weeks ago and diagnosed with kidney stones with left-sided intrarenal stones and a right proximal ureteral obstructive stone.  He followed up with Dr. Lonna CobbStoioff with an abdominal x-ray and the obstructive calculus was no longer seen.  He had a telephone call with urology 1 to 2 days ago and the plan is for an outpatient renal ultrasound to determine if the hydronephrosis has resolved.  He comes in tonight because of acute onset severe left flank pain that is sharp and stabbing it feels similar to the pain he felt on the right.  It occurred about 6 hours prior to me seeing him, about 3 hours prior to arrival in the emergency department.  First he took some of the pain medicine he was given in the ED the last time but it did not help and he said he was shaking from the severity of the pain.  He denies nausea and vomiting.  He denies fever/chills, sore throat, chest pain, cough, shortness of breath, nausea, vomiting, and diarrhea.  He has not seen any blood in his urine.  The pain has gotten better on its own and is currently a mild ache on the left side of his abdomen but mostly in his flank.  He is not having any difficulty with urination.        Past Medical History:  Diagnosis Date  . Asthma   . Kidney stone     Patient Active Problem List   Diagnosis Date Noted  . Ureteral calculus 04/30/2019  . Hydronephrosis with urinary obstruction due to ureteral calculus 04/30/2019  . Nephrolithiasis 04/30/2019  . GERD (gastroesophageal reflux disease) 04/22/2018  . Childhood asthma 04/22/2018   . Hx of migraines 07/09/2017    Past Surgical History:  Procedure Laterality Date  . ANKLE SURGERY Left    screw put in    Prior to Admission medications   Medication Sig Start Date End Date Taking? Authorizing Provider  HYDROcodone-acetaminophen (NORCO/VICODIN) 5-325 MG tablet Take 1-2 tablets by mouth every 6 (six) hours as needed for moderate pain or severe pain. Patient not taking: Reported on 04/30/2019 04/20/19   Sharyn CreamerQuale, Mark, MD  ondansetron (ZOFRAN ODT) 4 MG disintegrating tablet Take 1 tablet (4 mg total) by mouth every 6 (six) hours as needed for nausea or vomiting. Patient not taking: Reported on 04/30/2019 04/20/19   Sharyn CreamerQuale, Mark, MD    Allergies Sulfasalazine and Sulfa antibiotics  Family History  Problem Relation Age of Onset  . Hypertension Mother   . Hypertension Maternal Grandmother   . Hypertension Maternal Grandfather     Social History Social History   Tobacco Use  . Smoking status: Never Smoker  . Smokeless tobacco: Never Used  Substance Use Topics  . Alcohol use: No  . Drug use: No    Review of Systems Constitutional: No fever/chills Eyes: No visual changes. ENT: No sore throat. Cardiovascular: Denies chest pain. Respiratory: Denies shortness of breath. Gastrointestinal: No abdominal pain.  No nausea, no vomiting.  No diarrhea.  No constipation. Genitourinary: No gross hematuria.  Negative for dysuria. Musculoskeletal: Left-sided  flank pain. Integumentary: Negative for rash. Neurological: Negative for headaches, focal weakness or numbness.   ____________________________________________   PHYSICAL EXAM:  VITAL SIGNS: ED Triage Vitals  Enc Vitals Group     BP 05/06/19 2140 (!) 138/93     Pulse Rate 05/06/19 2140 60     Resp 05/06/19 2140 18     Temp 05/06/19 2140 98 F (36.7 C)     Temp Source 05/06/19 2140 Oral     SpO2 05/06/19 2140 97 %     Weight 05/06/19 2142 79.4 kg (175 lb)     Height 05/06/19 2142 1.702 m (5\' 7" )     Head  Circumference --      Peak Flow --      Pain Score --      Pain Loc --      Pain Edu? --      Excl. in GC? --     Constitutional: Alert and oriented.  No acute distress at this time. Eyes: Conjunctivae are normal.  Head: Atraumatic. Nose: No congestion/rhinnorhea. Mouth/Throat: Mucous membranes are moist. Neck: No stridor.  No meningeal signs.   Cardiovascular: Normal rate, regular rhythm. Good peripheral circulation. Grossly normal heart sounds. Respiratory: Normal respiratory effort.  No retractions. Gastrointestinal: Soft and nontender. No distention.  Musculoskeletal: Mild left CVA tenderness to percussion.  No lower extremity tenderness nor edema. No gross deformities of extremities. Neurologic:  Normal speech and language. No gross focal neurologic deficits are appreciated.  Skin:  Skin is warm, dry and intact. Psychiatric: Mood and affect are normal. Speech and behavior are normal.  ____________________________________________   LABS (all labs ordered are listed, but only abnormal results are displayed)  Labs Reviewed  CBC WITH DIFFERENTIAL/PLATELET - Abnormal; Notable for the following components:      Result Value   WBC 13.1 (*)    Neutro Abs 10.2 (*)    All other components within normal limits  COMPREHENSIVE METABOLIC PANEL - Abnormal; Notable for the following components:   Glucose, Bld 126 (*)    Calcium 8.8 (*)    ALT 52 (*)    All other components within normal limits  URINALYSIS, COMPLETE (UACMP) WITH MICROSCOPIC - Abnormal; Notable for the following components:   Color, Urine YELLOW (*)    APPearance CLOUDY (*)    Hgb urine dipstick LARGE (*)    Protein, ur 30 (*)    RBC / HPF >50 (*)    Bacteria, UA RARE (*)    All other components within normal limits   ____________________________________________  EKG  No indication for EKG ____________________________________________  RADIOLOGY I, Eric Rose, personally viewed and evaluated these images  (plain radiographs) as part of my medical decision making, as well as reviewing the written report by the radiologist.  ED MD interpretation: Probable 2 mm left ureteral calculus on radiograph.  Renal ultrasound was normal.  Official radiology report(s): Dg Abdomen 1 View  Result Date: 05/07/2019 CLINICAL DATA:  Left flank pain EXAM: ABDOMEN - 1 VIEW COMPARISON:  April 30, 2019 FINDINGS: The bowel gas pattern is normal. No definite calculi seen overlying the renal shadows. There is however a 2 mm calcifications seen within the left upper abdomen which could be in the proximal ureter. IMPRESSION: 2 mm calcification seen in the left upper abdomen which could be projecting over the left ureter. Electronically Signed   By: Jonna Clark M.D.   On: 05/07/2019 01:36   US Renal  Result Date: 05/07/2019 CLINICAL DATA:  Left  flank pain EXAM: RENAL / URINARY TRACT ULTRASOUND COMPLETE COMPARISON:  None. FINDINGS: Right Kidney: Renal measurements: 11.8 x 4.9 x 5.0 = volume: 150 mL . Echogenicity within normal limits. No mass or hydronephrosis visualized. Left Kidney: Renal measurements: 12.3 x 6.2 x 6.1 = volume: 240 mL. Echogenicity within normal limits. No mass or hydronephrosis visualized. Bladder: Appears normal for degree of bladder distention. Bilateral ureteral jets are seen. IMPRESSION: Normal renal and bladder ultrasound. Electronically Signed   By: Jonna ClarkBindu  Avutu M.D.   On: 05/07/2019 02:34    ____________________________________________   PROCEDURES   Procedure(s) performed (including Critical Care):  Procedures   ____________________________________________   INITIAL IMPRESSION / MDM / ASSESSMENT AND PLAN / ED COURSE  As part of my medical decision making, I reviewed the following data within the electronic MEDICAL RECORD NUMBER Nursing notes reviewed and incorporated, Labs reviewed , Old chart reviewed, Radiograph reviewed , Notes from prior ED visits and [place] Controlled Substance Database    Differential diagnosis includes, but is not limited to, renal/ureteral colic, UTI/pyelonephritis, diverticulitis, musculoskeletal strain.  The patient is well-appearing in no distress at this time and he said that his pain has improved significantly since coming to the ED.  His vital signs are stable, he is afebrile, and not tachycardic.  Reassuring physical exam.  I reviewed the medical record and see that he had some intrarenal stones on the left side previously and it is likely that 1 of these has dropped down into the ureter.  Given his recent scan and young age, I am trying to avoid getting another possibly unnecessary CT scan on him with the associated radiation exposure and cost.  I will proceed the way that urology proceeded in clinic, by getting a 1 view abdomen x-ray to look for any clinically significant calculus, and I am getting the renal ultrasound recommended as an outpatient to see if he has hydronephrosis on either side.  The patient's pain is currently well controlled and I am not ordering any medication at this time but he will let me know if the pain returns.  I anticipate discharge and outpatient follow-up with urology.  CBC demonstrates a very mild leukocytosis of unclear significance of 13.1 and a normal comprehensive metabolic panel other than a very slightly elevated ALT of 52 which is likely not clinically significant either.      Clinical Course as of May 06 721  Thu May 07, 2019  0144 Hematuria, no evidence of acute infection  Urinalysis, Complete w Microscopic(!) [CF]  0144 Probable 2-mm left ureteral calculus  DG Abdomen 1 View [CF]  [phone] Patient has been resting and sleeping comfortably.  No acute pain at this time.  Ultrasound and looks normal with no evidence of hydronephrosis on either side.  I updated the patient and will discharge him for urology follow-up as an outpatient as needed.  I gave my usual customary return precautions.  He confirmed that he still has  medication from his last episode of ureteral colic.   [CF]    Clinical Course User Index [CF] Eric RoseForbach, Praneel Haisley, MD     ____________________________________________  FINAL CLINICAL IMPRESSION(S) / ED DIAGNOSES  Final diagnoses:  Acute left flank pain  Kidney stones     MEDICATIONS GIVEN DURING THIS VISIT:  Medications - No data to display   ED Discharge Orders    None      *Please note:  Eric ChanceRodney Vazques Jr. was evaluated in Emergency Department on 05/07/2019 for the symptoms described in  the history of present illness. He was evaluated in the context of the global COVID-19 pandemic, which necessitated consideration that the patient might be at risk for infection with the SARS-CoV-2 virus that causes COVID-19. Institutional protocols and algorithms that pertain to the evaluation of patients at risk for COVID-19 are in a state of rapid change based on information released by regulatory bodies including the CDC and federal and state organizations. These policies and algorithms were followed during the patient's care in the ED.  Some ED evaluations and interventions may be delayed as a result of limited staffing during the pandemic.*  Note:  This document was prepared using Dragon voice recognition software and may include unintentional dictation errors.   Hinda Kehr, MD 05/07/19 [phone]

## 2019-05-19 ENCOUNTER — Telehealth: Payer: Self-pay | Admitting: Urology

## 2019-05-19 NOTE — Telephone Encounter (Signed)
Yes, okay to cancel.

## 2019-05-19 NOTE — Telephone Encounter (Signed)
Your ordered a RUS on this patient but he ended up back in the ER on 05-07-19 and had it done there. Is ok to cancel the one you ordered?   Sharyn Lull

## 2020-06-26 ENCOUNTER — Encounter (HOSPITAL_COMMUNITY): Payer: Self-pay

## 2020-06-26 ENCOUNTER — Other Ambulatory Visit: Payer: Self-pay

## 2020-06-26 ENCOUNTER — Emergency Department (HOSPITAL_COMMUNITY): Payer: No Typology Code available for payment source

## 2020-06-26 ENCOUNTER — Observation Stay (HOSPITAL_COMMUNITY)
Admission: EM | Admit: 2020-06-26 | Discharge: 2020-06-27 | Disposition: A | Discharge status: Home / Self Care | Payer: No Typology Code available for payment source | Attending: General Surgery | Admitting: General Surgery

## 2020-06-26 DIAGNOSIS — Y92410 Unspecified street and highway as the place of occurrence of the external cause: Secondary | ICD-10-CM | POA: Insufficient documentation

## 2020-06-26 DIAGNOSIS — Z20822 Contact with and (suspected) exposure to covid-19: Secondary | ICD-10-CM | POA: Diagnosis not present

## 2020-06-26 DIAGNOSIS — R0789 Other chest pain: Secondary | ICD-10-CM | POA: Insufficient documentation

## 2020-06-26 DIAGNOSIS — S3991XA Unspecified injury of abdomen, initial encounter: Secondary | ICD-10-CM | POA: Diagnosis present

## 2020-06-26 DIAGNOSIS — S36523A Contusion of sigmoid colon, initial encounter: Secondary | ICD-10-CM | POA: Diagnosis not present

## 2020-06-26 DIAGNOSIS — Y9389 Activity, other specified: Secondary | ICD-10-CM | POA: Insufficient documentation

## 2020-06-26 DIAGNOSIS — S20219A Contusion of unspecified front wall of thorax, initial encounter: Secondary | ICD-10-CM | POA: Insufficient documentation

## 2020-06-26 LAB — HIV ANTIBODY (ROUTINE TESTING W REFLEX): HIV Screen 4th Generation wRfx: NONREACTIVE

## 2020-06-26 LAB — COMPREHENSIVE METABOLIC PANEL
ALT: 77 U/L — ABNORMAL HIGH (ref 0–44)
AST: 42 U/L — ABNORMAL HIGH (ref 15–41)
Albumin: 4.2 g/dL (ref 3.5–5.0)
Alkaline Phosphatase: 79 U/L (ref 38–126)
Anion gap: 8 (ref 5–15)
BUN: 9 mg/dL (ref 6–20)
CO2: 26 mmol/L (ref 22–32)
Calcium: 9 mg/dL (ref 8.9–10.3)
Chloride: 106 mmol/L (ref 98–111)
Creatinine, Ser: 0.84 mg/dL (ref 0.61–1.24)
GFR, Estimated: >60 mL/min (ref >60)
Glucose, Bld: 101 mg/dL — ABNORMAL HIGH (ref 70–99)
Potassium: 4 mmol/L (ref 3.5–5.1)
Sodium: 140 mmol/L (ref 135–145)
Total Bilirubin: 1.3 mg/dL — ABNORMAL HIGH (ref 0.3–1.2)
Total Protein: 6.8 g/dL (ref 6.5–8.1)

## 2020-06-26 LAB — CBC
HCT: 49.3 % (ref 39.0–52.0)
Hemoglobin: 17.2 g/dL — ABNORMAL HIGH (ref 13.0–17.0)
MCH: 33 pg (ref 26.0–34.0)
MCHC: 34.9 g/dL (ref 30.0–36.0)
MCV: 94.6 fL (ref 80.0–100.0)
Platelets: 184 10*3/uL (ref 150–400)
RBC: 5.21 MIL/uL (ref 4.22–5.81)
RDW: 12.6 % (ref 11.5–15.5)
WBC: 11.7 10*3/uL — ABNORMAL HIGH (ref 4.0–10.5)
nRBC: 0 % (ref 0.0–0.2)

## 2020-06-26 LAB — RESP PANEL BY RT PCR (RSV, FLU A&B, COVID)
Influenza A by PCR: NEGATIVE
Influenza B by PCR: NEGATIVE
Respiratory Syncytial Virus by PCR: NEGATIVE
SARS Coronavirus 2 by RT PCR: NEGATIVE

## 2020-06-26 LAB — CBC WITH DIFFERENTIAL/PLATELET
Abs Immature Granulocytes: 0.07 10*3/uL (ref 0.00–0.07)
Basophils Absolute: 0 10*3/uL (ref 0.0–0.1)
Basophils Relative: 0 %
Eosinophils Absolute: 0 10*3/uL (ref 0.0–0.5)
Eosinophils Relative: 0 %
HCT: 50.7 % (ref 39.0–52.0)
Hemoglobin: 17.6 g/dL — ABNORMAL HIGH (ref 13.0–17.0)
Immature Granulocytes: 1 %
Lymphocytes Relative: 5 %
Lymphs Abs: 0.7 10*3/uL (ref 0.7–4.0)
MCH: 32.7 pg (ref 26.0–34.0)
MCHC: 34.7 g/dL (ref 30.0–36.0)
MCV: 94.1 fL (ref 80.0–100.0)
Monocytes Absolute: 1.3 10*3/uL — ABNORMAL HIGH (ref 0.1–1.0)
Monocytes Relative: 10 %
Neutro Abs: 11.9 10*3/uL — ABNORMAL HIGH (ref 1.7–7.7)
Neutrophils Relative %: 84 %
Platelets: 178 10*3/uL (ref 150–400)
RBC: 5.39 MIL/uL (ref 4.22–5.81)
RDW: 12.6 % (ref 11.5–15.5)
WBC: 14 10*3/uL — ABNORMAL HIGH (ref 4.0–10.5)
nRBC: 0 % (ref 0.0–0.2)

## 2020-06-26 LAB — CREATININE, SERUM
Creatinine, Ser: 0.79 mg/dL (ref 0.61–1.24)
GFR, Estimated: >60 mL/min (ref >60)

## 2020-06-26 MED ORDER — MORPHINE SULFATE (PF) 2 MG/ML IV SOLN: 2.0000 mg | INTRAVENOUS | Status: DC | PRN

## 2020-06-26 MED ORDER — ACETAMINOPHEN 325 MG PO TABS
650.0000 mg | ORAL_TABLET | ORAL | Status: DC | PRN
  Administered 2020-06-26 – 2020-06-27 (×2): 650 mg via ORAL
  Filled 2020-06-26 (×2): qty 2

## 2020-06-26 MED ORDER — OXYCODONE HCL 5 MG PO TABS
5.0000 mg | ORAL_TABLET | ORAL | Status: DC | PRN
  Filled 2020-06-26: qty 1

## 2020-06-26 MED ORDER — METOPROLOL TARTRATE 5 MG/5ML IV SOLN: 5.0000 mg | Freq: Four times a day (QID) | INTRAVENOUS | Status: DC | PRN

## 2020-06-26 MED ORDER — ONDANSETRON 4 MG PO TBDP: 4.0000 mg | ORAL_TABLET | Freq: Four times a day (QID) | ORAL | Status: DC | PRN

## 2020-06-26 MED ORDER — DEXTROSE-NACL 5-0.45 % IV SOLN: INTRAVENOUS | Status: DC

## 2020-06-26 MED ORDER — ONDANSETRON HCL 4 MG/2ML IJ SOLN: 4.0000 mg | Freq: Four times a day (QID) | INTRAMUSCULAR | Status: DC | PRN

## 2020-06-26 MED ORDER — ENOXAPARIN SODIUM 30 MG/0.3ML ~~LOC~~ SOLN: 30.0000 mg | Freq: Two times a day (BID) | SUBCUTANEOUS | Status: DC

## 2020-06-26 MED ORDER — IOHEXOL 300 MG/ML  SOLN
100.0000 mL | Freq: Once | INTRAMUSCULAR | Status: AC | PRN
  Administered 2020-06-26: 100 mL via INTRAVENOUS

## 2020-06-26 NOTE — Plan of Care (Signed)
  Problem: Education: Goal: Verbalization of understanding the information provided will improve Outcome: Progressing   
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
## 2020-06-26 NOTE — ED Notes (Signed)
Attempted to call report, per charge nurse Marissa unit is full and they can't take a new patient

## 2020-06-26 NOTE — ED Notes (Signed)
Pt given ice water, per Peggy - RN.

## 2020-06-27 MED ORDER — ACETAMINOPHEN 325 MG PO TABS: 650.0000 mg | ORAL_TABLET | Freq: Four times a day (QID) | ORAL | Status: AC | PRN

## 2020-06-27 MED ORDER — METHOCARBAMOL 750 MG PO TABS
750.0000 mg | ORAL_TABLET | Freq: Three times a day (TID) | ORAL | 0 refills | Status: AC | PRN
Start: 2020-06-27 — End: ?
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
## 2020-06-27 NOTE — Progress Notes (Signed)
Pt ate regular food for breakfast and tolerated it well with no abdominal pain. Pt given discharge instructions and gone over with him and he verbalized understanding. All belongings gathered by patient to be taken home. Pt in no distress at discharge.

## 2020-07-13 ENCOUNTER — Other Ambulatory Visit: Payer: Self-pay

## 2020-07-13 ENCOUNTER — Encounter: Payer: Self-pay | Admitting: Family Medicine

## 2020-07-13 ENCOUNTER — Ambulatory Visit (INDEPENDENT_AMBULATORY_CARE_PROVIDER_SITE_OTHER): Payer: No Typology Code available for payment source | Admitting: Family Medicine

## 2020-07-13 VITALS — BP 110/82 | HR 81 | Temp 98.0°F | Ht 64.75 in | Wt 186.5 lb

## 2020-07-13 DIAGNOSIS — R0789 Other chest pain: Secondary | ICD-10-CM | POA: Diagnosis not present

## 2020-07-13 DIAGNOSIS — M533 Sacrococcygeal disorders, not elsewhere classified: Secondary | ICD-10-CM | POA: Diagnosis not present
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
